# Patient Record
Sex: Male | Born: 1990 | Race: White | Hispanic: No | Marital: Single | Smoking: Never smoker
Health system: Southern US, Community
[De-identification: ages and names within clinical notes are randomized; demographics above are authoritative.]

## PROBLEM LIST (undated history)

## (undated) HISTORY — PX: TONSILLECTOMY: SUR1361

## (undated) HISTORY — PX: CHOLECYSTECTOMY: SHX55

## (undated) HISTORY — PX: APPENDECTOMY: SHX54

---
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
## 2011-12-07 ENCOUNTER — Ambulatory Visit: Payer: Self-pay | Admitting: Physical Therapy

## 2011-12-18 ENCOUNTER — Ambulatory Visit
Payer: No Typology Code available for payment source | Attending: Physical Medicine and Rehabilitation | Admitting: Physical Therapy

## 2011-12-18 DIAGNOSIS — R5381 Other malaise: Secondary | ICD-10-CM | POA: Insufficient documentation

## 2011-12-18 DIAGNOSIS — M546 Pain in thoracic spine: Secondary | ICD-10-CM | POA: Insufficient documentation

## 2011-12-18 DIAGNOSIS — M545 Low back pain, unspecified: Secondary | ICD-10-CM | POA: Insufficient documentation

## 2011-12-18 DIAGNOSIS — IMO0001 Reserved for inherently not codable concepts without codable children: Secondary | ICD-10-CM | POA: Insufficient documentation

## 2011-12-21 ENCOUNTER — Ambulatory Visit: Payer: No Typology Code available for payment source | Admitting: Physical Therapy

## 2011-12-26 ENCOUNTER — Encounter: Payer: Self-pay | Admitting: Physical Therapy

## 2011-12-27 ENCOUNTER — Ambulatory Visit: Payer: No Typology Code available for payment source | Admitting: Physical Therapy

## 2011-12-28 ENCOUNTER — Ambulatory Visit: Payer: No Typology Code available for payment source | Admitting: Physical Therapy

## 2012-01-03 ENCOUNTER — Ambulatory Visit: Payer: No Typology Code available for payment source | Admitting: *Deleted

## 2012-01-04 ENCOUNTER — Ambulatory Visit: Payer: No Typology Code available for payment source | Admitting: Physical Therapy

## 2012-01-09 ENCOUNTER — Ambulatory Visit
Payer: No Typology Code available for payment source | Attending: Physical Medicine and Rehabilitation | Admitting: Physical Therapy

## 2012-01-09 DIAGNOSIS — M545 Low back pain, unspecified: Secondary | ICD-10-CM | POA: Insufficient documentation

## 2012-01-09 DIAGNOSIS — M546 Pain in thoracic spine: Secondary | ICD-10-CM | POA: Insufficient documentation

## 2012-01-09 DIAGNOSIS — R5381 Other malaise: Secondary | ICD-10-CM | POA: Insufficient documentation

## 2012-01-09 DIAGNOSIS — IMO0001 Reserved for inherently not codable concepts without codable children: Secondary | ICD-10-CM | POA: Insufficient documentation

## 2012-01-11 ENCOUNTER — Ambulatory Visit: Payer: No Typology Code available for payment source | Admitting: Physical Therapy

## 2012-01-16 ENCOUNTER — Ambulatory Visit: Payer: No Typology Code available for payment source | Admitting: Physical Therapy

## 2012-01-18 ENCOUNTER — Ambulatory Visit: Payer: No Typology Code available for payment source | Admitting: Physical Therapy

## 2012-01-23 ENCOUNTER — Ambulatory Visit: Payer: No Typology Code available for payment source | Admitting: Physical Therapy

## 2012-01-25 ENCOUNTER — Ambulatory Visit: Payer: No Typology Code available for payment source | Admitting: Physical Therapy

## 2012-08-06 ENCOUNTER — Other Ambulatory Visit: Payer: Self-pay | Admitting: Orthopaedic Surgery

## 2012-08-06 DIAGNOSIS — M25562 Pain in left knee: Secondary | ICD-10-CM

## 2012-08-10 ENCOUNTER — Ambulatory Visit
Admission: RE | Admit: 2012-08-10 | Discharge: 2012-08-10 | Disposition: A | Discharge status: Home / Self Care | Payer: No Typology Code available for payment source | Source: Ambulatory Visit | Attending: Orthopaedic Surgery | Admitting: Orthopaedic Surgery

## 2012-08-10 DIAGNOSIS — M25562 Pain in left knee: Secondary | ICD-10-CM

## 2013-06-16 ENCOUNTER — Telehealth: Payer: Self-pay | Admitting: Nurse Practitioner

## 2013-06-16 NOTE — Telephone Encounter (Signed)
appt with Paulene Floormary martin at 11:15

## 2013-06-17 ENCOUNTER — Ambulatory Visit (INDEPENDENT_AMBULATORY_CARE_PROVIDER_SITE_OTHER): Payer: Managed Care, Other (non HMO)

## 2013-06-17 ENCOUNTER — Ambulatory Visit (INDEPENDENT_AMBULATORY_CARE_PROVIDER_SITE_OTHER): Payer: Managed Care, Other (non HMO) | Admitting: Nurse Practitioner

## 2013-06-17 ENCOUNTER — Encounter (INDEPENDENT_AMBULATORY_CARE_PROVIDER_SITE_OTHER): Payer: Self-pay

## 2013-06-17 ENCOUNTER — Encounter: Payer: Self-pay | Admitting: Nurse Practitioner

## 2013-06-17 VITALS — BP 117/81 | HR 82 | Temp 99.1°F | Ht 75.0 in | Wt 260.0 lb

## 2013-06-17 DIAGNOSIS — M549 Dorsalgia, unspecified: Secondary | ICD-10-CM

## 2013-06-17 DIAGNOSIS — R109 Unspecified abdominal pain: Secondary | ICD-10-CM

## 2013-06-17 DIAGNOSIS — Z87442 Personal history of urinary calculi: Secondary | ICD-10-CM

## 2013-06-17 LAB — POCT URINALYSIS DIPSTICK
Bilirubin, UA: NEGATIVE
Blood, UA: NEGATIVE
Glucose, UA: NEGATIVE
Ketones, UA: NEGATIVE
Nitrite, UA: NEGATIVE
Protein, UA: NEGATIVE
Spec Grav, UA: <=1.005
Urobilinogen, UA: NEGATIVE
pH, UA: 6

## 2013-06-17 LAB — POCT UA - MICROSCOPIC ONLY
BACTERIA, U MICROSCOPIC: NEGATIVE
Casts, Ur, LPF, POC: NEGATIVE
Crystals, Ur, HPF, POC: NEGATIVE
Mucus, UA: NEGATIVE
RBC, urine, microscopic: NEGATIVE
Yeast, UA: NEGATIVE

## 2013-06-17 MED ORDER — TAMSULOSIN HCL 0.4 MG PO CAPS: 0.4000 mg | ORAL_CAPSULE | Freq: Every day | ORAL | Status: DC

## 2013-06-17 MED ORDER — HYDROCODONE-ACETAMINOPHEN 10-325 MG PO TABS: 1.0000 | ORAL_TABLET | Freq: Three times a day (TID) | ORAL | Status: DC | PRN

## 2013-06-17 NOTE — Progress Notes (Signed)
   Subjective:    Patient ID: Sean Harmon, male    DOB: 12/13/1990, 23 y.o.   MRN: [id number]  HPI Patient in C/o right flank pain and testicular pan- started 1 week ago and has progressively goten worse- Has history of kidney stones.    Review of Systems  Genitourinary: Positive for flank pain and testicular pain.  All other systems reviewed and are negative.      Objective:   Physical Exam  Constitutional: He is oriented to person, place, and time. He appears well-developed and well-nourished.  Cardiovascular: Normal rate, regular rhythm and normal heart sounds.   Pulmonary/Chest: Effort normal and breath sounds normal.  Abdominal: Soft. Bowel sounds are normal. He exhibits no distension. There is no tenderness.  Genitourinary:  Right CVA tenderness  Neurological: He is alert and oriented to person, place, and time.  Skin: Skin is warm and dry.  Psychiatric: He has a normal mood and affect. His behavior is normal. Judgment and thought content normal.   BP 117/81  Pulse 82  Temp(Src) 99.1 F (37.3 C) (Oral)  Ht 6\' 3"  (1.905 m)  Wt 260 lb (117.935 kg)  BMI 32.50 kg/m2 KUB- no stone visible due to stool content-Preliminary reading by Paulene FloorMary Kiasia Chou, FNP  [facility] Results for orders placed in visit on 06/17/13  POCT URINALYSIS DIPSTICK      Result Value Ref Range   Color, UA yellow     Clarity, UA clear     Glucose, UA neg     Bilirubin, UA neg     Ketones, UA neg     Spec Grav, UA <=1.005     Blood, UA neg     pH, UA 6.0     Protein, UA neg     Urobilinogen, UA negative     Nitrite, UA neg     Leukocytes, UA Trace    POCT UA - MICROSCOPIC ONLY      Result Value Ref Range   WBC, Ur, HPF, POC 5-10     RBC, urine, microscopic neg     Bacteria, U Microscopic neg     Mucus, UA neg     Epithelial cells, urine per micros occ     Crystals, Ur, HPF, POC neg     Casts, Ur, LPF, POC neg     Yeast, UA neg            Assessment & Plan:   1. Abdominal pain,  unspecified site   2. CVA tenderness   3. History of kidney stones    Meds ordered this encounter  Medications  . tamsulosin (FLOMAX) 0.4 MG CAPS capsule    Sig: Take 1 capsule (0.4 mg total) by mouth daily.    Dispense:  30 capsule    Refill:  3    Order Specific Question:  Supervising Provider    Answer:  Ernestina PennaMOORE, DONALD W [[id number]]  . HYDROcodone-acetaminophen (NORCO) 10-325 MG per tablet    Sig: Take 1 tablet by mouth every 8 (eight) hours as needed.    Dispense:  30 tablet    Refill:  0    Order Specific Question:  Supervising Provider    Answer:  Ernestina PennaMOORE, DONALD W [[id number]]   Force fluids Strain urine RTO prn  Mary-Margaret Daphine DeutscherMartin, FNP

## 2013-06-17 NOTE — Patient Instructions (Signed)
Kidney Stones  Kidney stones (urolithiasis) are deposits that form inside your kidneys. The intense pain is caused by the stone moving through the urinary tract. When the stone moves, the ureter goes into spasm around the stone. The stone is usually passed in the urine.   CAUSES   · A disorder that makes certain neck glands produce too much parathyroid hormone (primary hyperparathyroidism).  · A buildup of uric acid crystals, similar to gout in your joints.  · Narrowing (stricture) of the ureter.  · A kidney obstruction present at birth (congenital obstruction).  · Previous surgery on the kidney or ureters.  · Numerous kidney infections.  SYMPTOMS   · Feeling sick to your stomach (nauseous).  · Throwing up (vomiting).  · Blood in the urine (hematuria).  · Pain that usually spreads (radiates) to the groin.  · Frequency or urgency of urination.  DIAGNOSIS   · Taking a history and physical exam.  · Blood or urine tests.  · CT scan.  · Occasionally, an examination of the inside of the urinary bladder (cystoscopy) is performed.  TREATMENT   · Observation.  · Increasing your fluid intake.  · Extracorporeal shock wave lithotripsy This is a noninvasive procedure that uses shock waves to break up kidney stones.  · Surgery may be needed if you have severe pain or persistent obstruction. There are various surgical procedures. Most of the procedures are performed with the use of small instruments. Only small incisions are needed to accommodate these instruments, so recovery time is minimized.  The size, location, and chemical composition are all important variables that will determine the proper choice of action for you. Talk to your health care provider to better understand your situation so that you will minimize the risk of injury to yourself and your kidney.   HOME CARE INSTRUCTIONS   · Drink enough water and fluids to keep your urine clear or pale yellow. This will help you to pass the stone or stone fragments.  · Strain  all urine through the provided strainer. Keep all particulate matter and stones for your health care provider to see. The stone causing the pain may be as small as a grain of salt. It is very important to use the strainer each and every time you pass your urine. The collection of your stone will allow your health care provider to analyze it and verify that a stone has actually passed. The stone analysis will often identify what you can do to reduce the incidence of recurrences.  · Only take over-the-counter or prescription medicines for pain, discomfort, or fever as directed by your health care provider.  · Make a follow-up appointment with your health care provider as directed.  · Get follow-up X-rays if required. The absence of pain does not always mean that the stone has passed. It may have only stopped moving. If the urine remains completely obstructed, it can cause loss of kidney function or even complete destruction of the kidney. It is your responsibility to make sure X-rays and follow-ups are completed. Ultrasounds of the kidney can show blockages and the status of the kidney. Ultrasounds are not associated with any radiation and can be performed easily in a matter of minutes.  SEEK MEDICAL CARE IF:  · You experience pain that is progressive and unresponsive to any pain medicine you have been prescribed.  SEEK IMMEDIATE MEDICAL CARE IF:   · Pain cannot be controlled with the prescribed medicine.  · You have a fever   or shaking chills.  · The severity or intensity of pain increases over 18 hours and is not relieved by pain medicine.  · You develop a new onset of abdominal pain.  · You feel faint or pass out.  · You are unable to urinate.  MAKE SURE YOU:   · Understand these instructions.  · Will watch your condition.  · Will get help right away if you are not doing well or get worse.  Document Released: 02/20/2005 Document Revised: 10/23/2012 Document Reviewed: 07/24/2012  Exit[employer]® Patient Information ©2014  [employer].

## 2013-07-29 ENCOUNTER — Telehealth: Payer: Self-pay | Admitting: Nurse Practitioner

## 2013-07-29 NOTE — Telephone Encounter (Signed)
appt made

## 2013-07-30 ENCOUNTER — Ambulatory Visit (INDEPENDENT_AMBULATORY_CARE_PROVIDER_SITE_OTHER): Payer: Managed Care, Other (non HMO) | Admitting: Physician Assistant

## 2013-07-30 ENCOUNTER — Encounter: Payer: Self-pay | Admitting: Physician Assistant

## 2013-07-30 VITALS — BP 128/76 | HR 91 | Temp 98.9°F | Ht 75.0 in | Wt 263.0 lb

## 2013-07-30 DIAGNOSIS — M542 Cervicalgia: Secondary | ICD-10-CM

## 2013-07-30 NOTE — Progress Notes (Signed)
Subjective:     Patient ID: Sean Harmon, male   DOB: May 31, 1990, 23 y.o.   MRN: [id number]  HPI Pt with R sided neck pain x 1 month Seen at urgent care and placed on steroids and ATB Sx cont despite taking entire course  Review of Systems Denies any pain or drainage from the ears No tooth or oral pain No injury to the neck No fever/chills     Objective:   Physical Exam NAD Ears- L canal and TM normal, R impacted with cerumen Irrigated and following canal and TM nl Oral- no lesions or increase in tonsil size No erythem to the gums or pain with palp of teeth No definite cerv nodes but ++ TTP R ant lateral neck No scalp lesions    Assessment:     Neck pain    Plan:     Due to cont sx will refer to ENT No further meds given since already failed above course F/U prn

## 2013-07-30 NOTE — Patient Instructions (Signed)
Lymphadenopathy °Lymphadenopathy means "disease of the lymph glands." But the term is usually used to describe swollen or enlarged lymph glands, also called lymph nodes. These are the bean-shaped organs found in many locations including the neck, underarm, and groin. Lymph glands are part of the immune system, which fights infections in your body. Lymphadenopathy can occur in just one area of the body, such as the neck, or it can be generalized, with lymph node enlargement in several areas. The nodes found in the neck are the most common sites of lymphadenopathy. °CAUSES  °When your immune system responds to germs (such as viruses or bacteria ), infection-fighting cells and fluid build up. This causes the glands to grow in size. This is usually not something to worry about. Sometimes, the glands themselves can become infected and inflamed. This is called lymphadenitis. °Enlarged lymph nodes can be caused by many diseases: °· Bacterial disease, such as strep throat or a skin infection. °· Viral disease, such as a common cold. °· Other germs, such as lyme disease, tuberculosis, or sexually transmitted diseases. °· Cancers, such as lymphoma (cancer of the lymphatic system) or leukemia (cancer of the white blood cells). °· Inflammatory diseases such as lupus or rheumatoid arthritis. °· Reactions to medications. °Many of the diseases above are rare, but important. This is why you should see your caregiver if you have lymphadenopathy. °SYMPTOMS  °· Swollen, enlarged lumps in the neck, back of the head or other locations. °· Tenderness. °· Warmth or redness of the skin over the lymph nodes. °· Fever. °DIAGNOSIS  °Enlarged lymph nodes are often near the source of infection. They can help healthcare providers diagnose your illness. For instance:  °· Swollen lymph nodes around the jaw might be caused by an infection in the mouth. °· Enlarged glands in the neck often signal a throat infection. °· Lymph nodes that are swollen  in more than one area often indicate an illness caused by a virus. °Your caregiver most likely will know what is causing your lymphadenopathy after listening to your history and examining you. Blood tests, x-rays or other tests may be needed. If the cause of the enlarged lymph node cannot be found, and it does not go away by itself, then a biopsy may be needed. Your caregiver will discuss this with you. °TREATMENT  °Treatment for your enlarged lymph nodes will depend on the cause. Many times the nodes will shrink to normal size by themselves, with no treatment. Antibiotics or other medicines may be needed for infection. Only take over-the-counter or prescription medicines for pain, discomfort or fever as directed by your caregiver. °HOME CARE INSTRUCTIONS  °Swollen lymph glands usually return to normal when the underlying medical condition goes away. If they persist, contact your health-care provider. He/she might prescribe antibiotics or other treatments, depending on the diagnosis. Take any medications exactly as prescribed. Keep any follow-up appointments made to check on the condition of your enlarged nodes.  °SEEK MEDICAL CARE IF:  °· Swelling lasts for more than two weeks. °· You have symptoms such as weight loss, night sweats, fatigue or fever that does not go away. °· The lymph nodes are hard, seem fixed to the skin or are growing rapidly. °· Skin over the lymph nodes is red and inflamed. This could mean there is an infection. °SEEK IMMEDIATE MEDICAL CARE IF:  °· Fluid starts leaking from the area of the enlarged lymph node. °· You develop a fever of 102° F (38.9° C) or greater. °· Severe   pain develops (not necessarily at the site of a large lymph node). °· You develop chest pain or shortness of breath. °· You develop worsening abdominal pain. °MAKE SURE YOU:  °· Understand these instructions. °· Will watch your condition. °· Will get help right away if you are not doing well or get worse. °Document  Released: 11/30/2007 Document Revised: 05/15/2011 Document Reviewed: 11/30/2007 °ExitCare® Patient Information ©2014 [employer]. ° °

## 2013-09-10 ENCOUNTER — Other Ambulatory Visit: Payer: Self-pay | Admitting: Otolaryngology

## 2013-09-10 DIAGNOSIS — M542 Cervicalgia: Secondary | ICD-10-CM

## 2013-09-12 ENCOUNTER — Ambulatory Visit
Admission: RE | Admit: 2013-09-12 | Discharge: 2013-09-12 | Disposition: A | Discharge status: Home / Self Care | Payer: Managed Care, Other (non HMO) | Source: Ambulatory Visit | Attending: Otolaryngology | Admitting: Otolaryngology

## 2013-09-12 DIAGNOSIS — M542 Cervicalgia: Secondary | ICD-10-CM

## 2013-09-12 MED ORDER — IOHEXOL 300 MG/ML  SOLN
75.0000 mL | Freq: Once | INTRAMUSCULAR | Status: AC | PRN
  Administered 2013-09-12: 75 mL via INTRAVENOUS

## 2013-10-25 ENCOUNTER — Encounter (HOSPITAL_COMMUNITY): Payer: Self-pay | Admitting: Emergency Medicine

## 2013-10-25 ENCOUNTER — Emergency Department (HOSPITAL_COMMUNITY)
Admission: EM | Admit: 2013-10-25 | Discharge: 2013-10-25 | Disposition: A | Discharge status: Home / Self Care | Payer: Managed Care, Other (non HMO) | Attending: Emergency Medicine | Admitting: Emergency Medicine

## 2013-10-25 DIAGNOSIS — J029 Acute pharyngitis, unspecified: Secondary | ICD-10-CM | POA: Diagnosis not present

## 2013-10-25 DIAGNOSIS — R070 Pain in throat: Secondary | ICD-10-CM | POA: Diagnosis not present

## 2013-10-25 DIAGNOSIS — R6889 Other general symptoms and signs: Secondary | ICD-10-CM | POA: Insufficient documentation

## 2013-10-25 MED ORDER — OXYCODONE-ACETAMINOPHEN 5-325 MG PO TABS
2.0000 | ORAL_TABLET | Freq: Once | ORAL | Status: AC
  Administered 2013-10-25: 2 via ORAL
  Filled 2013-10-25: qty 2

## 2013-10-25 NOTE — ED Notes (Signed)
Presents one week post tonsilectomy, taking a pain pill this evening and became lodged in throat. Pt then vomited and threw up blood. Reports that he still feels the pill in his throat and it caused some SOB.  Airway intact. Throat red.

## 2013-10-25 NOTE — Discharge Instructions (Signed)
Follow up with your PCP or ENT surgeon for further evaluation of throat discomfort. Return to ED if you experience fevers, Shortness of breath, Inability to swallow

## 2013-10-25 NOTE — ED Notes (Signed)
Water given PO to assess swallowing.

## 2013-10-25 NOTE — ED Provider Notes (Signed)
CSN: [id number]     Arrival date & time 10/25/13  0107 History   First MD Initiated Contact with Patient 10/25/13 0601     Chief Complaint  Patient presents with  . pill stuck in throat      (Consider location/radiation/quality/duration/timing/severity/associated sxs/prior Treatment) HPI Ramy Greth III is a 23 y.o. male status post tonsillectomy last Thursday and presents for evaluation of throat discomfort. He reports he was taking his pain medication last night around 11:00 and felt like a pill became lodged in the back of his throat. He also reports he either "coughed up or threw up and there was a little bit of blood in it." He said that once before last week he thought a pill became lodged in his throat and cough some shortness of breath but that resolved. He denies any fevers, difficulty breathing, trouble swallowing, or chest pain.  History reviewed. No pertinent past medical history. Past Surgical History  Procedure Laterality Date  . Appendectomy    . Cholecystectomy    . Tonsillectomy     History reviewed. No pertinent family history. History  Substance Use Topics  . Smoking status: Never Smoker   . Smokeless tobacco: Not on file  . Alcohol Use: No    Review of Systems  Constitutional: Negative for fever.  HENT: Positive for sore throat.   Respiratory: Negative for shortness of breath.   Cardiovascular: Negative for chest pain.  Skin: Negative for rash.      Allergies  Review of patient's allergies indicates no known allergies.  Home Medications   Prior to Admission medications   Not on File   BP 139/76  Pulse 79  Temp(Src) 98.3 F (36.8 C) (Oral)  Resp 20  Wt 262 lb 7 oz (119.041 kg)  SpO2 100% Physical Exam  Nursing note and vitals reviewed. Constitutional: He appears well-developed and well-nourished. No distress.  HENT:  Head: Normocephalic and atraumatic.  Posterior oropharynx erythematous and mild inflammation. Uvula midline. No sign of  foreign body/pill. No active bleeding. Patent Airway. Handling secretions well.  Eyes: Conjunctivae and EOM are normal. Right eye exhibits no discharge. Left eye exhibits no discharge. No scleral icterus.  Neck: Normal range of motion. Neck supple.  Cardiovascular: Normal rate, regular rhythm and normal heart sounds.   Pulmonary/Chest: Effort normal and breath sounds normal. No respiratory distress.  Skin: Skin is warm and dry. No rash noted. He is not diaphoretic.    ED Course  Procedures (including critical care time) Labs Review Labs Reviewed - No data to display  Imaging Review No results found.   EKG Interpretation None     Filed Vitals:   10/25/13 0118 10/25/13 0530 10/25/13 0600 10/25/13 0706  BP: 138/80 125/68 124/69 139/76  Pulse: 88 79 72 79  Temp: 98.3 F (36.8 C)     TempSrc: Oral     Resp: 16   20  Weight: 262 lb 7 oz (119.041 kg)     SpO2: 98% 99% 99% 100%    MDM  Vitals stable - WNL - afebrile Resting comfortably. NAD, Speaking in full sentences. No emesis during ED stay No evidence of active bleeding or lodged pill/foreign body. Received Percocet PO and was able to tolerate swallowing with water. Requests to go home now. Provided reassurance. F/u with PCP and/or ENT surgeon. Discussed return precautions. Pt amenable to this plan.  Final diagnoses:  Throat pain   Prior to patient discharge, I discussed and reviewed this case with Dr.Otter  Sharlene MottsBenjamin W Carmin Alvidrez, PA-C 10/25/13 [id number]

## 2013-10-27 NOTE — ED Provider Notes (Signed)
Medical screening examination/treatment/procedure(s) were performed by non-physician practitioner and as supervising physician I was immediately available for consultation/collaboration.   EKG Interpretation None       Issam Carlyon M Komal Stangelo, MD 10/27/13 1334 

## 2013-12-18 ENCOUNTER — Encounter (INDEPENDENT_AMBULATORY_CARE_PROVIDER_SITE_OTHER): Payer: Self-pay

## 2013-12-18 ENCOUNTER — Ambulatory Visit (INDEPENDENT_AMBULATORY_CARE_PROVIDER_SITE_OTHER): Payer: Managed Care, Other (non HMO) | Admitting: Family Medicine

## 2013-12-18 VITALS — BP 128/75 | HR 82 | Temp 98.2°F | Ht 74.5 in | Wt 273.0 lb

## 2013-12-18 DIAGNOSIS — K2100 Gastro-esophageal reflux disease with esophagitis, without bleeding: Secondary | ICD-10-CM

## 2013-12-18 MED ORDER — OMEPRAZOLE 20 MG PO CPDR: 20.0000 mg | DELAYED_RELEASE_CAPSULE | Freq: Every day | ORAL | Status: DC

## 2013-12-18 NOTE — Progress Notes (Signed)
   Subjective:    Patient ID: Shawna Clampollie Pilson III, male    DOB: 07/17/1990, 23 y.o.   MRN: [id number]  HPI C/o epigastric abdominal discomfort after eating.   Review of Systems    No chest pain, SOB, HA, dizziness, vision change, N/V, diarrhea, constipation, dysuria, urinary urgency or frequency, myalgias, arthralgias or rash.  Objective:   Physical Exam Vital signs noted  Well developed well nourished male.  HEENT - Head atraumatic Normocephalic                Eyes - PERRLA, Conjuctiva - clear Sclera- Clear EOMI                Ears - EAC's Wnl TM's Wnl Gross Hearing WNL                Throat - oropharanx wnl Respiratory - Lungs CTA bilateral Cardiac - RRR S1 and S2 without murmur GI - Abdomen soft and epigastric region tender and bowel sounds active x 4        Assessment & Plan:  Gastroesophageal reflux disease with esophagitis - Plan: omeprazole (PRILOSEC) 20 MG capsule  Deatra CanterWilliam J Oxford FNP

## 2013-12-19 ENCOUNTER — Telehealth: Payer: Self-pay | Admitting: Family Medicine

## 2013-12-19 NOTE — Telephone Encounter (Signed)
Patient was seen yesterday. I feel comfortable giving him a note for yesterday but not back dating it to Monday. Left message on voicemail that I would need to send this to his provider for authorization.  Is this something you want to authorize?

## 2013-12-19 NOTE — Telephone Encounter (Signed)
Joyce GrossKay can you take care of this for me thanks

## 2013-12-22 NOTE — Telephone Encounter (Signed)
Patient aware letter up front for patient to pick up.

## 2014-02-20 ENCOUNTER — Ambulatory Visit: Payer: Managed Care, Other (non HMO) | Admitting: Family Medicine

## 2014-11-19 ENCOUNTER — Telehealth: Payer: Self-pay | Admitting: Family Medicine

## 2014-11-19 NOTE — Telephone Encounter (Signed)
Pt came by to pick up.

## 2015-06-15 ENCOUNTER — Encounter: Payer: Self-pay | Admitting: Nurse Practitioner

## 2015-06-15 ENCOUNTER — Ambulatory Visit (INDEPENDENT_AMBULATORY_CARE_PROVIDER_SITE_OTHER): Payer: Managed Care, Other (non HMO) | Admitting: Nurse Practitioner

## 2015-06-15 VITALS — BP 130/78 | HR 69 | Temp 97.5°F | Ht 74.0 in | Wt 304.0 lb

## 2015-06-15 DIAGNOSIS — R509 Fever, unspecified: Secondary | ICD-10-CM

## 2015-06-15 DIAGNOSIS — J069 Acute upper respiratory infection, unspecified: Secondary | ICD-10-CM

## 2015-06-15 LAB — VERITOR FLU A/B WAIVED
INFLUENZA A: NEGATIVE
INFLUENZA B: NEGATIVE

## 2015-06-15 MED ORDER — AZITHROMYCIN 250 MG PO TABS: ORAL_TABLET | ORAL | Status: AC

## 2015-06-15 NOTE — Patient Instructions (Signed)

## 2015-06-15 NOTE — Progress Notes (Signed)
   Subjective:    Patient ID: Sean Harmon, male    DOB: 11/15/1990, 25 y.o.   MRN: [id number]  HPIPatient in c/o fever- fever started Sunday afternoon- 102-103- slight achiness-cough and congestion    Review of Systems  Constitutional: Positive for fever and chills.  HENT: Positive for congestion, ear pain and sinus pressure. Negative for sore throat, trouble swallowing and voice change.   Respiratory: Positive for cough. Negative for shortness of breath.   Cardiovascular: Negative.   Genitourinary: Negative.   Neurological: Negative.   Psychiatric/Behavioral: Negative.   All other systems reviewed and are negative.      Objective:   Physical Exam  Constitutional: He is oriented to person, place, and time. He appears well-developed and well-nourished. No distress.  HENT:  Right Ear: Hearing, tympanic membrane, external ear and ear canal normal.  Left Ear: Hearing, tympanic membrane, external ear and ear canal normal.  Mouth/Throat: Uvula is midline, oropharynx is clear and moist and mucous membranes are normal.  Cardiovascular: Normal rate, regular rhythm and normal heart sounds.   Pulmonary/Chest: Effort normal and breath sounds normal.  Neurological: He is alert and oriented to person, place, and time.  Skin: Skin is warm.  Psychiatric: He has a normal mood and affect. His behavior is normal. Judgment and thought content normal.    BP 130/78 mmHg  Pulse 69  Temp(Src) 97.5 F (36.4 C) (Oral)  Ht 6\' 2"  (1.88 m)  Wt 304 lb (137.893 kg)  BMI 39.01 kg/m2   Flu negative    Assessment & Plan:  1. Fever, unspecified - Veritor Flu A/B Waived  2. Upper respiratory infection 1. Take meds as prescribed 2. Use a cool mist humidifier especially during the winter months and when heat has been humid. 3. Use saline nose sprays frequently 4. Saline irrigations of the nose can be very helpful if done frequently.  * 4X daily for 1 week*  * Use of a nettie pot can be helpful  with this. Follow directions with this* 5. Drink plenty of fluids 6. Keep thermostat turn down low 7.For any cough or congestion  Use plain Mucinex- regular strength or max strength is fine   * Children- consult with Pharmacist for dosing 8. For fever or aces or pains- take tylenol or ibuprofen appropriate for age and weight.  * for fevers greater than 101 orally you may alternate ibuprofen and tylenol every  3 hours.   Meds ordered this encounter  Medications  . azithromycin (ZITHROMAX Z-PAK) 250 MG tablet    Sig: As directed    Dispense:  1 each    Refill:  0    Order Specific Question:  Supervising Provider    Answer:  Ernestina PennaMOORE, DONALD W [[id number]    Mary-Margaret Daphine DeutscherMartin, FNP

## 2015-06-16 ENCOUNTER — Telehealth: Payer: Self-pay | Admitting: Family Medicine

## 2015-06-16 NOTE — Telephone Encounter (Signed)
Letter placed up front, Patient aware

## 2015-12-03 IMAGING — CR DG ABDOMEN 1V
1 series · 1 of 1 positions shown · non-contrast
Comparison: CT abdomen and pelvis July 18, 2010

CLINICAL DATA: Abdominal pain

EXAM:
ABDOMEN - 1 VIEW

[view not recorded]
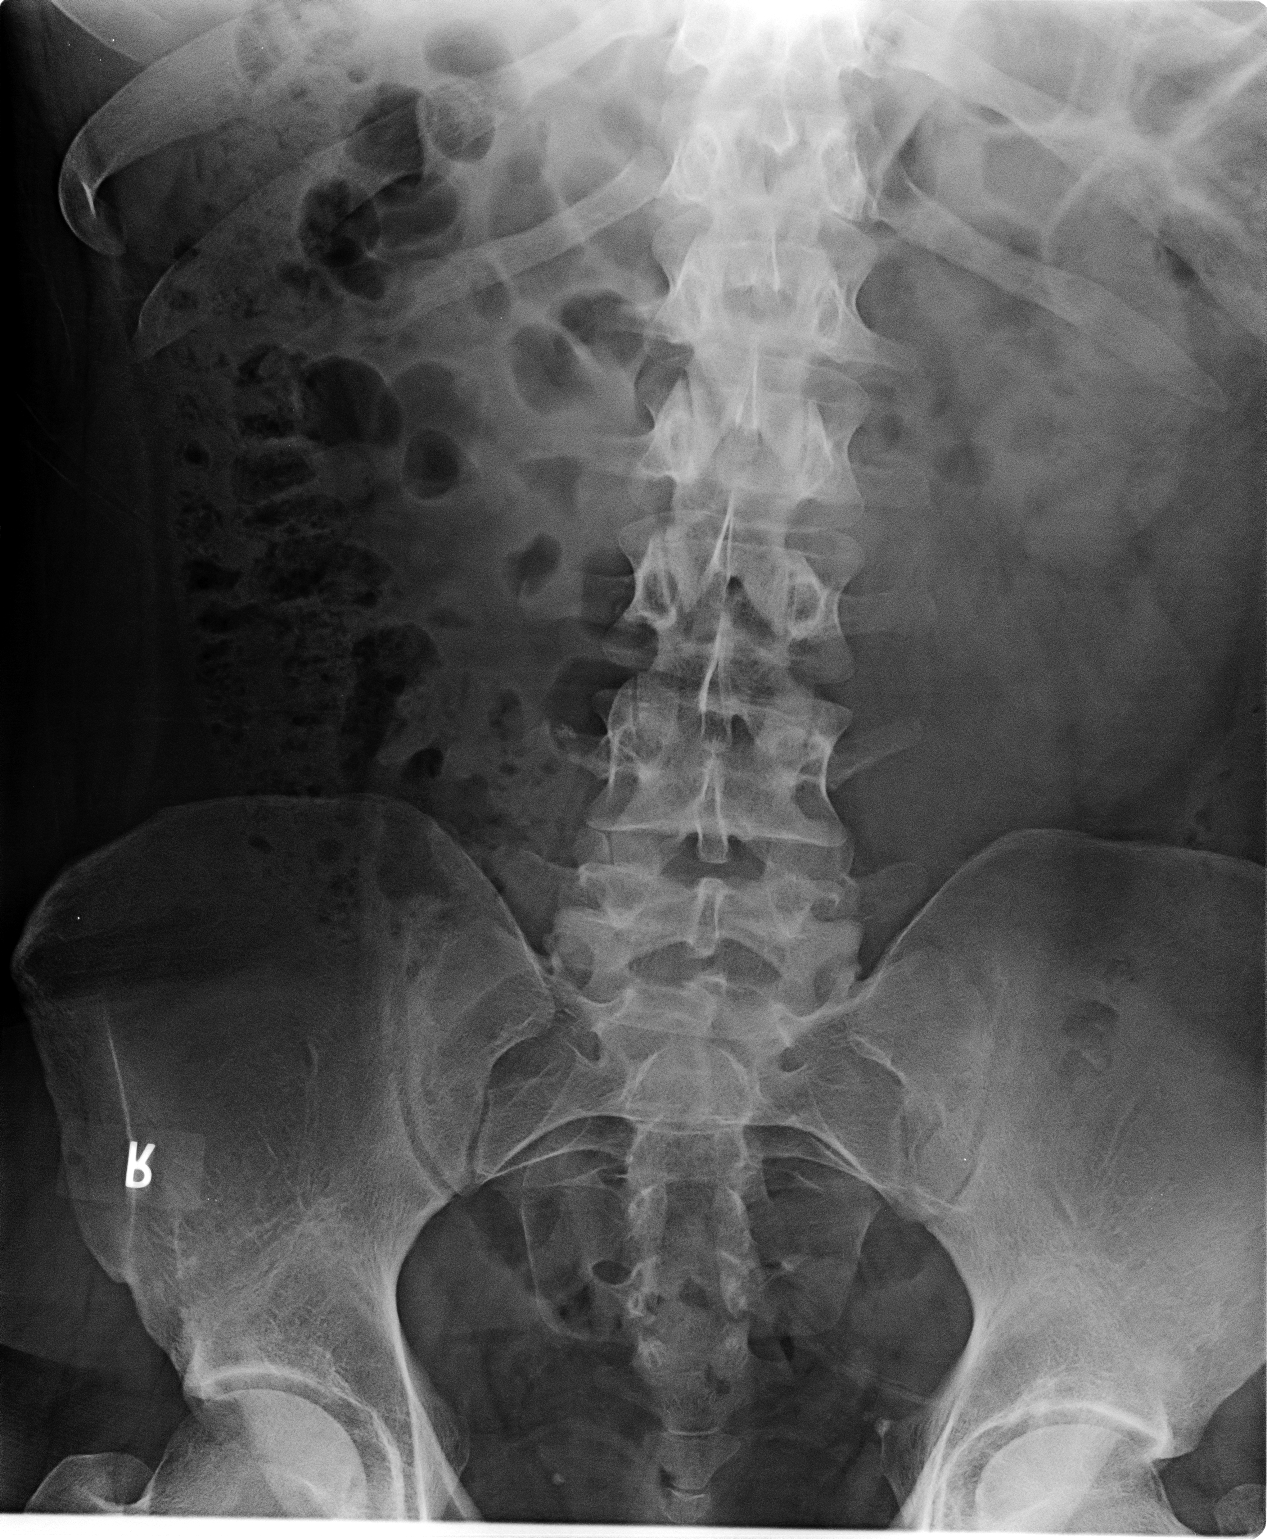

[1 of 1 positions shown; findings below may reference images not displayed]

FINDINGS: There is moderate stool in the colon. The bowel gas pattern is
normal. No obstruction or free air is seen on this supine
examination. There is a phlebolith in the mid right pelvis.
IMPRESSION: Bowel gas pattern unremarkable.  Moderate stool in colon.

## 2016-02-28 IMAGING — CT CT NECK W/ CM
4 of 5 series · 16 of 33 positions shown, 19 images · IV contrast (75CC OMNI 300)
Comparison: None.

CLINICAL DATA: Right neck pain. Lump. Evaluate right submandibular
gland.

EXAM:
CT NECK WITH CONTRAST
TECHNIQUE: Multidetector CT imaging of the neck was performed using the
standard protocol following the bolus administration of intravenous
contrast.
CONTRAST:  75mL OMNIPAQUE IOHEXOL 300 MG/ML  SOLN

[Series 3: axial neck · axial · 0.49mm/px · z∈[-34,+76]mm · 3 of 110 slices shown]
[im 22/110  bone]
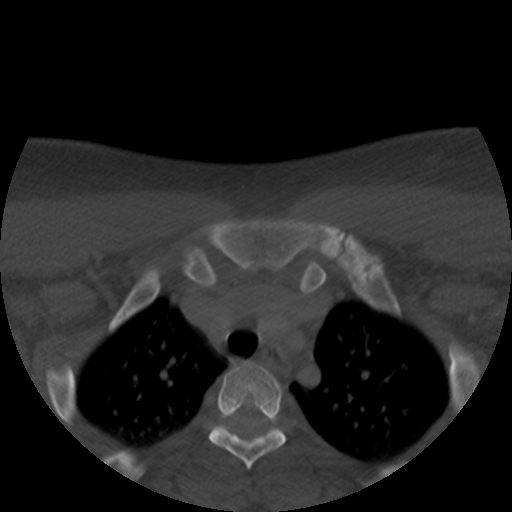
[im 44/110  bone]
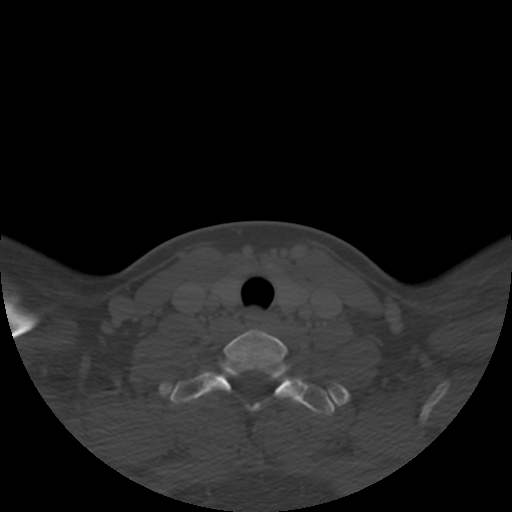
[im 66/110  bone]
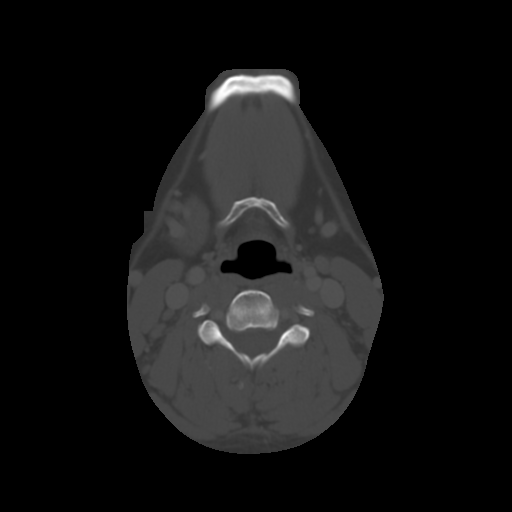

[Series 200: cor · coronal · 0.54mm/px · 3 of 110 slices shown]
[im 22/110  bone]
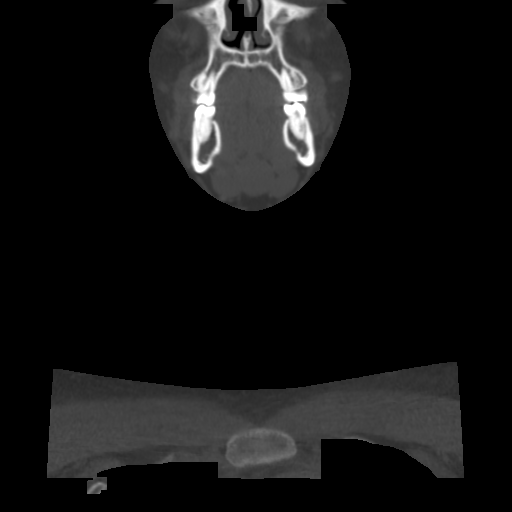
[im 44/110  bone]
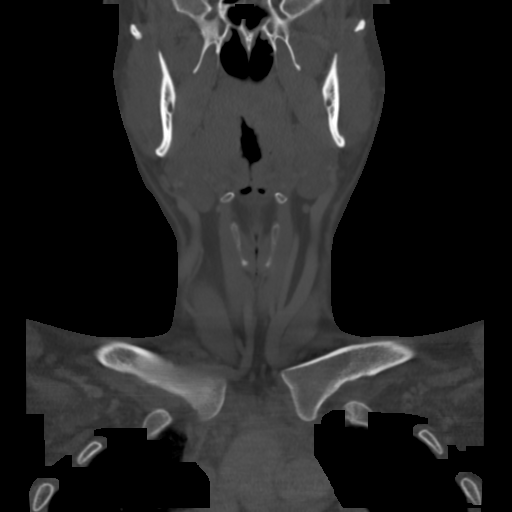
[im 66/110  bone]
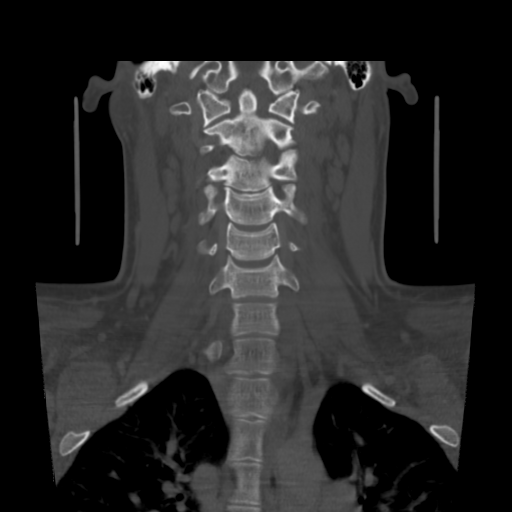

[Series 201: angled axial · axial · 0.54mm/px · z∈[-90,+97]mm · 5 of 152 slices shown, 7 images]
[im 26/152  soft-tissue]
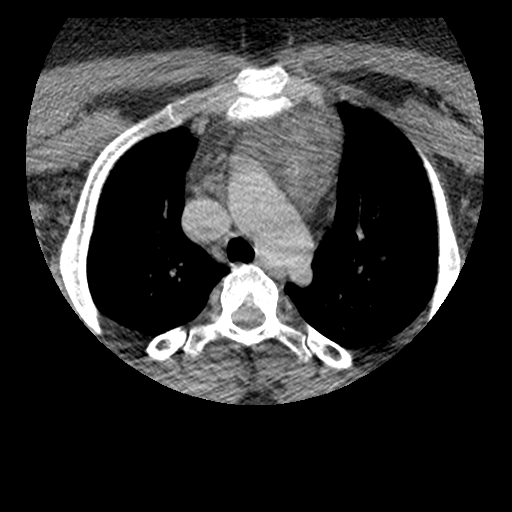
[im 26/152  bone]
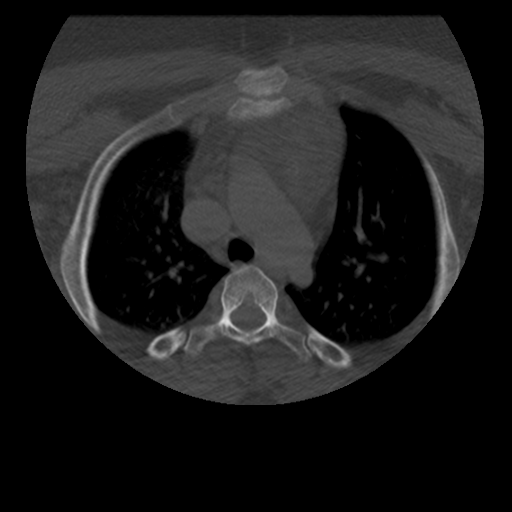
[im 51/152  bone]
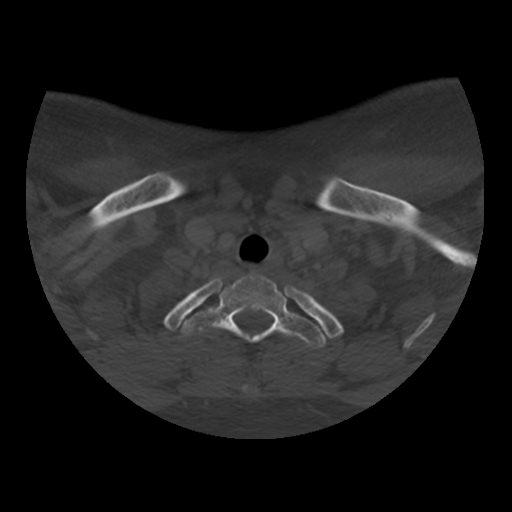
[im 76/152  bone]
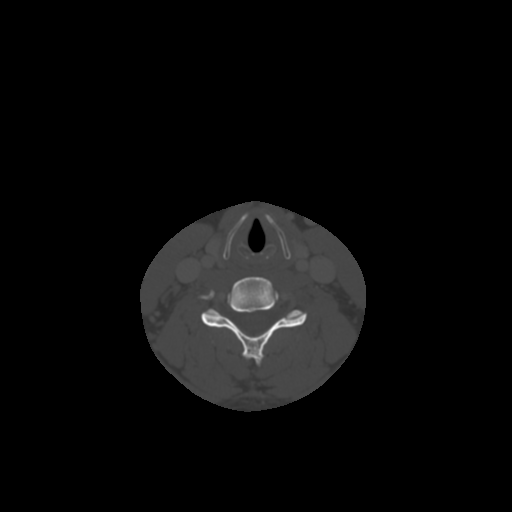
[im 101/152  bone]
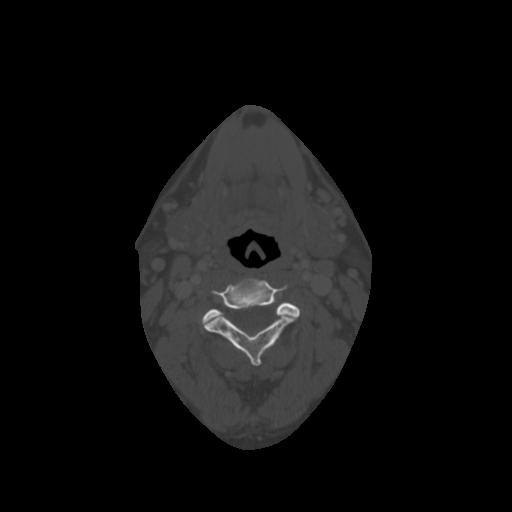
[im 126/152  soft-tissue]
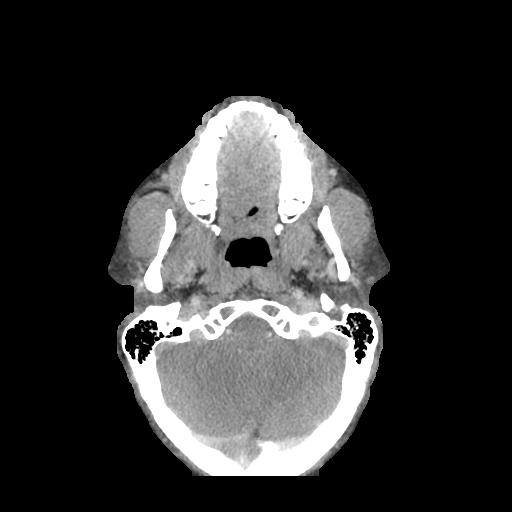
[im 126/152  bone]
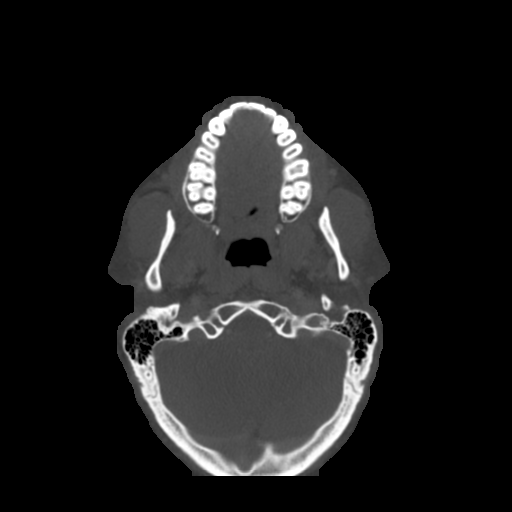

[Series 202: sag · sagittal · 0.54mm/px · 5 of 92 slices shown, 6 images]
[im 31/92  bone]
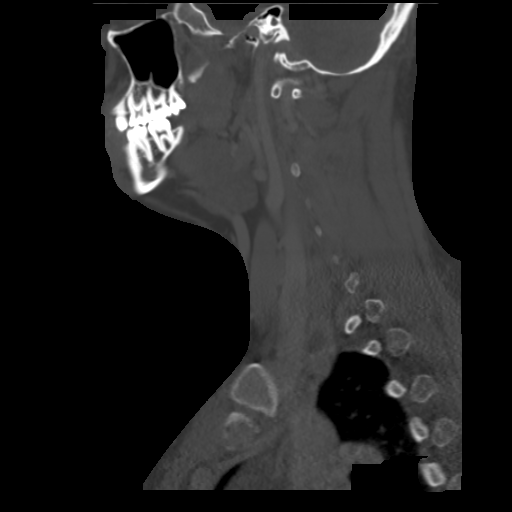
[im 38/92  bone]
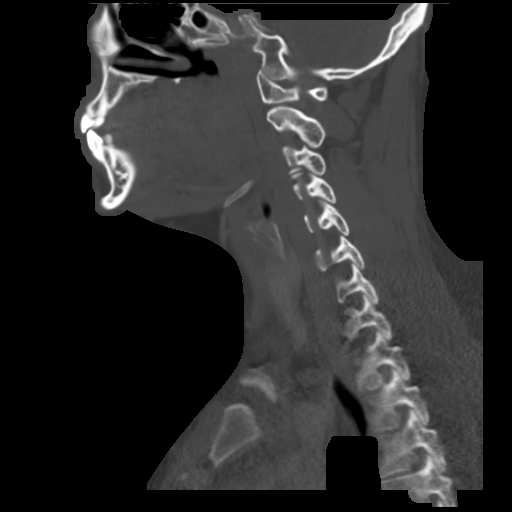
[im 46/92  soft-tissue]
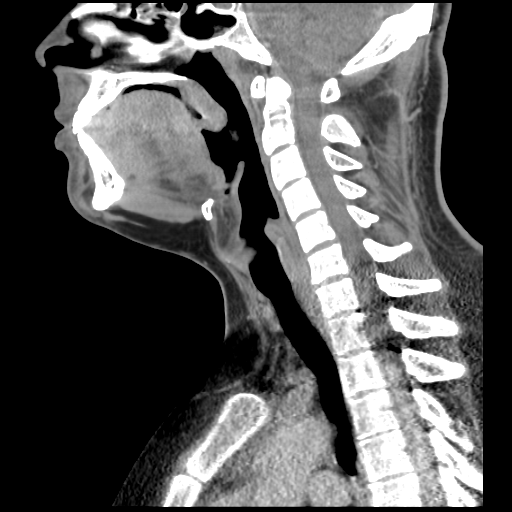
[im 46/92  bone]
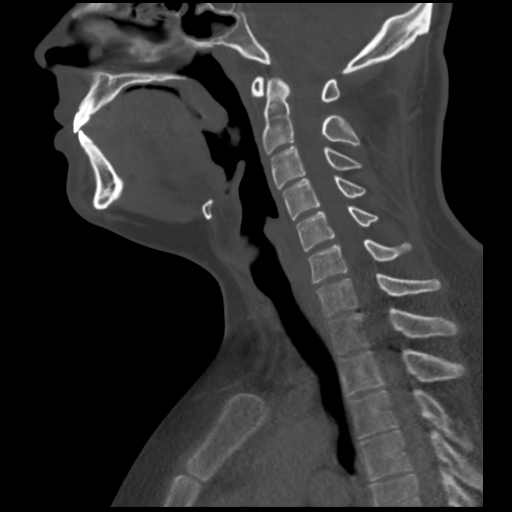
[im 54/92  bone]
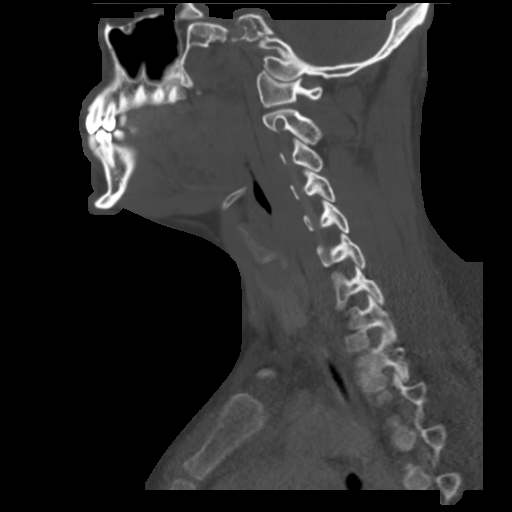
[im 61/92  bone]
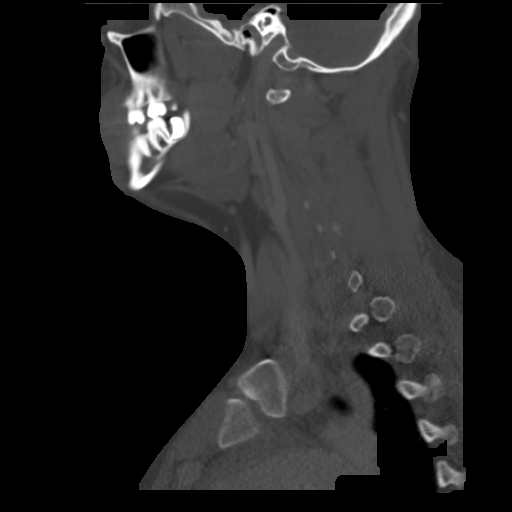

[16 of 33 positions shown; findings below may reference images not displayed]

FINDINGS: Limited imaging of the brain is unremarkable. There is moderate
prominence of the palatine tonsils bilaterally. No focal lesion or
abscess is present. Parapharyngeal fat is clear bilaterally. No
retropharyngeal nodes are present. The tongue base is unremarkable.
The submandibular glands are symmetric. No focal nodule or mass
lesion is present. A right submandibular lymph node is benign in
appearance but measures 2.6 x 1.3 cm. A left jugulodigastric lymph
node measures 3.2 x 1.1 cm. Posterior level 2 lymph nodes measure up
to 16 mm on the left and 17 mm on the right in long axis.

The larynx is intact.  The vocal cords are midline and symmetric.

The infraglottic neck is unremarkable. The thyroid is normal. The
lung apices are clear.
IMPRESSION: 1. No acute or focal abnormality of the right submandibular gland.
The duct is normal.
2. Mild bilateral enlargement of the palatine tonsils is likely
inflammatory.
3. Bilateral level 2 lymphadenopathy. Although several nodes are
enlarged, these maintain benign morphology and are likely reactive.
